# Patient Record
Sex: Female | Born: 1955 | Race: White | Hispanic: No | Marital: Married | Smoking: Current every day smoker
Health system: Southern US, Community
[De-identification: ages and names within clinical notes are randomized; demographics above are authoritative.]

## PROBLEM LIST (undated history)

## (undated) DIAGNOSIS — I10 Essential (primary) hypertension: Secondary | ICD-10-CM

## (undated) DIAGNOSIS — N390 Urinary tract infection, site not specified: Secondary | ICD-10-CM

## (undated) DIAGNOSIS — M1990 Unspecified osteoarthritis, unspecified site: Secondary | ICD-10-CM

## (undated) DIAGNOSIS — R519 Headache, unspecified: Secondary | ICD-10-CM

## (undated) DIAGNOSIS — N838 Other noninflammatory disorders of ovary, fallopian tube and broad ligament: Secondary | ICD-10-CM

## (undated) DIAGNOSIS — R51 Headache: Secondary | ICD-10-CM

## (undated) DIAGNOSIS — Z9889 Other specified postprocedural states: Secondary | ICD-10-CM

## (undated) DIAGNOSIS — R000 Tachycardia, unspecified: Secondary | ICD-10-CM

## (undated) DIAGNOSIS — Z87442 Personal history of urinary calculi: Secondary | ICD-10-CM

## (undated) DIAGNOSIS — M5020 Other cervical disc displacement, unspecified cervical region: Secondary | ICD-10-CM

## (undated) DIAGNOSIS — R112 Nausea with vomiting, unspecified: Secondary | ICD-10-CM

## (undated) DIAGNOSIS — G8929 Other chronic pain: Secondary | ICD-10-CM

## (undated) HISTORY — PX: OTHER SURGICAL HISTORY: SHX169

## (undated) HISTORY — PX: NASAL SINUS SURGERY: SHX719

## (undated) HISTORY — PX: ABDOMINAL HYSTERECTOMY: SHX81

## (undated) HISTORY — PX: BREAST BIOPSY: SHX20

## (undated) HISTORY — PX: CHOLECYSTECTOMY: SHX55

---

## 1986-03-30 HISTORY — PX: MICROTUBOPLASTY: SHX5401

## 2015-01-29 ENCOUNTER — Other Ambulatory Visit: Payer: Self-pay | Admitting: Neurosurgery

## 2015-01-29 DIAGNOSIS — M5412 Radiculopathy, cervical region: Secondary | ICD-10-CM

## 2015-02-04 ENCOUNTER — Other Ambulatory Visit: Payer: Self-pay

## 2015-02-07 ENCOUNTER — Ambulatory Visit
Admission: RE | Admit: 2015-02-07 | Discharge: 2015-02-07 | Disposition: A | Discharge status: Home / Self Care | Payer: BLUE CROSS/BLUE SHIELD | Source: Ambulatory Visit | Attending: Neurosurgery | Admitting: Neurosurgery

## 2015-02-07 DIAGNOSIS — M5412 Radiculopathy, cervical region: Secondary | ICD-10-CM

## 2015-02-07 MED ORDER — DIAZEPAM 5 MG PO TABS
10.0000 mg | ORAL_TABLET | Freq: Once | ORAL | Status: AC
  Administered 2015-02-07: 10 mg via ORAL

## 2015-02-07 MED ORDER — IOHEXOL 300 MG/ML  SOLN
10.0000 mL | Freq: Once | INTRAMUSCULAR | Status: DC | PRN
  Administered 2015-02-07: 10 mL via INTRATHECAL

## 2015-02-07 MED ORDER — ONDANSETRON HCL 4 MG/2ML IJ SOLN
4.0000 mg | Freq: Once | INTRAMUSCULAR | Status: AC
  Administered 2015-02-07: 4 mg via INTRAMUSCULAR

## 2015-02-07 MED ORDER — MEPERIDINE HCL 100 MG/ML IJ SOLN
100.0000 mg | Freq: Once | INTRAMUSCULAR | Status: AC
  Administered 2015-02-07: 100 mg via INTRAMUSCULAR

## 2015-02-07 NOTE — Discharge Instructions (Signed)
Myelogram Discharge Instructions  1. Go home and rest quietly for the next 24 hours.  It is important to lie flat for the next 24 hours.  Get up only to go to the restroom.  You may lie in the bed or on a couch on your back, your stomach, your left side or your right side.  You may have one pillow under your head.  You may have pillows between your knees while you are on your side or under your knees while you are on your back.  2. DO NOT drive today.  Recline the seat as far back as it will go, while still wearing your seat belt, on the way home.  3. You may get up to go to the bathroom as needed.  You may sit up for 10 minutes to eat.  You may resume your normal diet and medications unless otherwise indicated.  Drink lots of extra fluids today and tomorrow.  4. The incidence of headache, nausea, or vomiting is about 5% (one in 20 patients).  If you develop a headache, lie flat and drink plenty of fluids until the headache goes away.  Caffeinated beverages may be helpful.  If you develop severe nausea and vomiting or a headache that does not go away with flat bed rest, call [phone].  5. You may resume normal activities after your 24 hours of bed rest is over; however, do not exert yourself strongly or do any heavy lifting tomorrow. If when you get up you have a headache when standing, go back to bed and force fluids for another 24 hours.  6. Call your physician for a follow-up appointment.  The results of your myelogram will be sent directly to your physician by the following day.  7. If you have any questions or if complications develop after you arrive home, please call [phone].  Discharge instructions have been explained to the patient.  The patient, or the person responsible for the patient, fully understands these instructions.       May resume Ritalin and Tramadol on Nov. 11, 2016, after 1:00 pm.

## 2015-02-07 NOTE — Progress Notes (Signed)
Patient states she has been off Ritalin and Tramadol for at least the past two days.

## 2015-03-07 ENCOUNTER — Other Ambulatory Visit: Payer: Self-pay | Admitting: Neurosurgery

## 2015-03-20 ENCOUNTER — Encounter (HOSPITAL_COMMUNITY)
Admission: RE | Admit: 2015-03-20 | Discharge: 2015-03-20 | Disposition: A | Discharge status: Home / Self Care | Payer: BLUE CROSS/BLUE SHIELD | Source: Ambulatory Visit | Attending: Neurosurgery | Admitting: Neurosurgery

## 2015-03-20 ENCOUNTER — Encounter (HOSPITAL_COMMUNITY): Payer: Self-pay

## 2015-03-20 ENCOUNTER — Inpatient Hospital Stay (HOSPITAL_COMMUNITY): Payer: BLUE CROSS/BLUE SHIELD | Admitting: Vascular Surgery

## 2015-03-20 ENCOUNTER — Inpatient Hospital Stay (HOSPITAL_COMMUNITY): Payer: BLUE CROSS/BLUE SHIELD | Admitting: Anesthesiology

## 2015-03-20 DIAGNOSIS — Z01818 Encounter for other preprocedural examination: Secondary | ICD-10-CM | POA: Insufficient documentation

## 2015-03-20 DIAGNOSIS — M4722 Other spondylosis with radiculopathy, cervical region: Secondary | ICD-10-CM | POA: Insufficient documentation

## 2015-03-20 DIAGNOSIS — Z01812 Encounter for preprocedural laboratory examination: Secondary | ICD-10-CM | POA: Insufficient documentation

## 2015-03-20 DIAGNOSIS — I10 Essential (primary) hypertension: Secondary | ICD-10-CM | POA: Insufficient documentation

## 2015-03-20 HISTORY — DX: Tachycardia, unspecified: R00.0

## 2015-03-20 HISTORY — DX: Other cervical disc displacement, unspecified cervical region: M50.20

## 2015-03-20 HISTORY — DX: Essential (primary) hypertension: I10

## 2015-03-20 HISTORY — DX: Headache, unspecified: R51.9

## 2015-03-20 HISTORY — DX: Headache: R51

## 2015-03-20 HISTORY — DX: Unspecified osteoarthritis, unspecified site: M19.90

## 2015-03-20 HISTORY — DX: Other specified postprocedural states: Z98.890

## 2015-03-20 HISTORY — DX: Urinary tract infection, site not specified: N39.0

## 2015-03-20 HISTORY — DX: Other noninflammatory disorders of ovary, fallopian tube and broad ligament: N83.8

## 2015-03-20 HISTORY — DX: Other chronic pain: G89.29

## 2015-03-20 HISTORY — DX: Personal history of urinary calculi: Z87.442

## 2015-03-20 HISTORY — DX: Nausea with vomiting, unspecified: R11.2

## 2015-03-20 LAB — CBC
HEMATOCRIT: 40.8 % (ref 36.0–46.0)
Hemoglobin: 13.7 g/dL (ref 12.0–15.0)
MCH: 29.5 pg (ref 26.0–34.0)
MCHC: 33.6 g/dL (ref 30.0–36.0)
MCV: 87.7 fL (ref 78.0–100.0)
PLATELETS: 256 10*3/uL (ref 150–400)
RBC: 4.65 MIL/uL (ref 3.87–5.11)
RDW: 13.7 % (ref 11.5–15.5)
WBC: 5.9 10*3/uL (ref 4.0–10.5)

## 2015-03-20 LAB — SURGICAL PCR SCREEN
MRSA, PCR: NEGATIVE
Staphylococcus aureus: NEGATIVE

## 2015-03-20 LAB — BASIC METABOLIC PANEL
ANION GAP: 8 (ref 5–15)
BUN: 13 mg/dL (ref 6–20)
CO2: 23 mmol/L (ref 22–32)
Calcium: 9.3 mg/dL (ref 8.9–10.3)
Chloride: 110 mmol/L (ref 101–111)
Creatinine, Ser: 0.79 mg/dL (ref 0.44–1.00)
GLUCOSE: 97 mg/dL (ref 65–99)
POTASSIUM: 4.2 mmol/L (ref 3.5–5.1)
Sodium: 141 mmol/L (ref 135–145)

## 2015-03-20 NOTE — Progress Notes (Signed)
PCP - [facility] - denies  EKG - 03/20/2015 CXR - denies  Echo/Stress test/cardiac cath - denies  Patient denies chest pain and shortness of breath at PAT appointment.  Patient educated to stop taking Soma, Celebrex, and all anti inflammatories 5-7 days prior to surgery.  Patient states that she will do her best but she may not be able to stop those medications because they are the only ones that help with pain.  Patient educated on the risks of continuing those medications.

## 2015-03-20 NOTE — Pre-Procedure Instructions (Signed)
    Darl PikesSusan Bauernfeind  03/20/2015      [id number] - [place], [place] - [address] [place] [id number] Phone: [phone] Fax: [phone]    Your procedure is scheduled on Wednesday, December 28th, 2016.  [facility]:15 [facility].M.  Call this number if you have problems the morning of surgery:  [phone]   Remember:  Do not eat food or drink liquids after midnight.   Take these medicines the morning of surgery with A SIP OF WATER: Cyclobenzaprine (Flexeril), Diltiazem (Tiazac), Hydrocodone-acetaminophen (Norco/Vicodin) if needed, Ondansetron (Zofran) if needed, Proair inhaler if needed (please bring with you), Tramadol (Ultram) if needed.  Stop taking: Carisoprodol (Soma), Celecoxib (Celebrex), Aspirin, NSAIDs, Aleve, Naproxen, Ibuprofen, Advil, Motrin, BC's, Goody's, Fish oil, all herbal medications, and all vitamins.    Do not wear jewelry, make-up or nail polish.  Do not wear lotions, powders, or perfumes.  You may wear deodorant.  Do not shave 48 hours prior to surgery.    Do not bring valuables to the hospital.  [facility] is not responsible for any belongings or valuables.  Contacts, dentures or bridgework may not be worn into surgery.  Leave your suitcase in the car.  After surgery it may be brought to your room.  For patients admitted to the hospital, discharge time will be determined by your treatment team.  Patients discharged the day of surgery will not be allowed to drive home.   Special instructions:  See attached.   Please read over the following fact sheets that you were given. Pain Booklet, Coughing and Deep Breathing, MRSA Information and Surgical Site Infection Prevention

## 2015-03-26 MED ORDER — CEFAZOLIN SODIUM-DEXTROSE 2-3 GM-% IV SOLR
2.0000 g | INTRAVENOUS | Status: AC
  Administered 2015-03-27: 2 g via INTRAVENOUS
  Filled 2015-03-26: qty 50

## 2015-03-27 ENCOUNTER — Encounter (HOSPITAL_COMMUNITY)
Admission: RE | Disposition: A | Discharge status: Home / Self Care | Payer: Self-pay | Source: Ambulatory Visit | Attending: Neurosurgery

## 2015-03-27 ENCOUNTER — Inpatient Hospital Stay (HOSPITAL_COMMUNITY): Payer: BLUE CROSS/BLUE SHIELD | Admitting: Anesthesiology

## 2015-03-27 ENCOUNTER — Encounter (HOSPITAL_COMMUNITY): Payer: Self-pay

## 2015-03-27 ENCOUNTER — Inpatient Hospital Stay (HOSPITAL_COMMUNITY)
Admission: RE | Admit: 2015-03-27 | Discharge: 2015-03-28 | DRG: 473 | Disposition: A | Discharge status: Home / Self Care | Payer: BLUE CROSS/BLUE SHIELD | Source: Ambulatory Visit | Attending: Neurosurgery | Admitting: Neurosurgery

## 2015-03-27 ENCOUNTER — Inpatient Hospital Stay (HOSPITAL_COMMUNITY): Payer: BLUE CROSS/BLUE SHIELD

## 2015-03-27 DIAGNOSIS — Z79899 Other long term (current) drug therapy: Secondary | ICD-10-CM | POA: Diagnosis not present

## 2015-03-27 DIAGNOSIS — F17210 Nicotine dependence, cigarettes, uncomplicated: Secondary | ICD-10-CM | POA: Diagnosis present

## 2015-03-27 DIAGNOSIS — Z419 Encounter for procedure for purposes other than remedying health state, unspecified: Secondary | ICD-10-CM

## 2015-03-27 DIAGNOSIS — M5010 Cervical disc disorder with radiculopathy, unspecified cervical region: Principal | ICD-10-CM | POA: Diagnosis present

## 2015-03-27 DIAGNOSIS — I10 Essential (primary) hypertension: Secondary | ICD-10-CM | POA: Diagnosis present

## 2015-03-27 DIAGNOSIS — M47892 Other spondylosis, cervical region: Secondary | ICD-10-CM | POA: Diagnosis present

## 2015-03-27 DIAGNOSIS — M47812 Spondylosis without myelopathy or radiculopathy, cervical region: Secondary | ICD-10-CM | POA: Diagnosis present

## 2015-03-27 DIAGNOSIS — M542 Cervicalgia: Secondary | ICD-10-CM | POA: Diagnosis present

## 2015-03-27 HISTORY — PX: ANTERIOR CERVICAL DECOMP/DISCECTOMY FUSION: SHX1161

## 2015-03-27 SURGERY — ANTERIOR CERVICAL DECOMPRESSION/DISCECTOMY FUSION 3 LEVELS
Anesthesia: General | Site: Neck

## 2015-03-27 MED ORDER — DEXAMETHASONE SODIUM PHOSPHATE 10 MG/ML IJ SOLN
INTRAMUSCULAR | Status: AC
  Filled 2015-03-27: qty 1

## 2015-03-27 MED ORDER — SUFENTANIL CITRATE 50 MCG/ML IV SOLN
INTRAVENOUS | Status: DC | PRN
  Administered 2015-03-27 (×3): 10 ug via INTRAVENOUS
  Administered 2015-03-27: 20 ug via INTRAVENOUS

## 2015-03-27 MED ORDER — MORPHINE SULFATE (PF) 2 MG/ML IV SOLN: 1.0000 mg | INTRAVENOUS | Status: DC | PRN

## 2015-03-27 MED ORDER — FENTANYL CITRATE (PF) 100 MCG/2ML IJ SOLN: INTRAMUSCULAR | Status: DC | PRN

## 2015-03-27 MED ORDER — CYCLOBENZAPRINE HCL 10 MG PO TABS: 10.0000 mg | ORAL_TABLET | Freq: Three times a day (TID) | ORAL | Status: DC | PRN

## 2015-03-27 MED ORDER — METHYLPHENIDATE HCL 5 MG PO TABS
20.0000 mg | ORAL_TABLET | ORAL | Status: DC
  Administered 2015-03-28: 20 mg via ORAL
  Filled 2015-03-27: qty 4

## 2015-03-27 MED ORDER — ONDANSETRON HCL 4 MG/2ML IJ SOLN
4.0000 mg | INTRAMUSCULAR | Status: DC | PRN
Start: 2015-03-27 — End: 2015-03-28
  Administered 2015-03-27: 4 mg via INTRAVENOUS
  Filled 2015-03-27: qty 2

## 2015-03-27 MED ORDER — LABETALOL HCL 5 MG/ML IV SOLN
INTRAVENOUS | Status: DC | PRN
  Administered 2015-03-27: 5 mg via INTRAVENOUS

## 2015-03-27 MED ORDER — SUCCINYLCHOLINE CHLORIDE 20 MG/ML IJ SOLN: INTRAMUSCULAR | Status: DC | PRN

## 2015-03-27 MED ORDER — LACTATED RINGERS IV SOLN: INTRAVENOUS | Status: DC

## 2015-03-27 MED ORDER — FENTANYL CITRATE (PF) 100 MCG/2ML IJ SOLN
INTRAMUSCULAR | Status: AC
  Filled 2015-03-27: qty 2

## 2015-03-27 MED ORDER — BISACODYL 10 MG RE SUPP: 10.0000 mg | Freq: Every day | RECTAL | Status: DC | PRN

## 2015-03-27 MED ORDER — METHYLPHENIDATE HCL 5 MG PO TABS: 10.0000 mg | ORAL_TABLET | Freq: Every day | ORAL | Status: DC

## 2015-03-27 MED ORDER — 0.9 % SODIUM CHLORIDE (POUR BTL) OPTIME
TOPICAL | Status: DC | PRN
  Administered 2015-03-27: 1000 mL

## 2015-03-27 MED ORDER — LIDOCAINE HCL (CARDIAC) 20 MG/ML IV SOLN: INTRAVENOUS | Status: DC | PRN

## 2015-03-27 MED ORDER — ALBUTEROL SULFATE HFA 108 (90 BASE) MCG/ACT IN AERS: 2.0000 | INHALATION_SPRAY | RESPIRATORY_TRACT | Status: DC | PRN

## 2015-03-27 MED ORDER — SODIUM CHLORIDE 0.9 % IR SOLN
Status: DC | PRN
  Administered 2015-03-27: 13:00:00

## 2015-03-27 MED ORDER — HYDROCODONE-ACETAMINOPHEN 5-325 MG PO TABS: 1.0000 | ORAL_TABLET | ORAL | Status: DC | PRN

## 2015-03-27 MED ORDER — PHENOL 1.4 % MT LIQD: 1.0000 | OROMUCOSAL | Status: DC | PRN

## 2015-03-27 MED ORDER — TRAMADOL HCL 50 MG PO TABS: 100.0000 mg | ORAL_TABLET | Freq: Three times a day (TID) | ORAL | Status: DC | PRN

## 2015-03-27 MED ORDER — HYDROCODONE-ACETAMINOPHEN 5-325 MG PO TABS
ORAL_TABLET | ORAL | Status: AC
  Filled 2015-03-27: qty 2

## 2015-03-27 MED ORDER — BACITRACIN ZINC 500 UNIT/GM EX OINT
TOPICAL_OINTMENT | CUTANEOUS | Status: DC | PRN
  Administered 2015-03-27: 1 via TOPICAL

## 2015-03-27 MED ORDER — SUFENTANIL CITRATE 50 MCG/ML IV SOLN
INTRAVENOUS | Status: AC
  Filled 2015-03-27: qty 1

## 2015-03-27 MED ORDER — ONDANSETRON HCL 4 MG PO TABS: 4.0000 mg | ORAL_TABLET | Freq: Three times a day (TID) | ORAL | Status: DC | PRN

## 2015-03-27 MED ORDER — DILTIAZEM HCL ER BEADS 240 MG PO CP24
240.0000 mg | ORAL_CAPSULE | Freq: Every day | ORAL | Status: DC
  Administered 2015-03-28: 240 mg via ORAL
  Filled 2015-03-27: qty 1
  Filled 2015-03-27: qty 2
  Filled 2015-03-27: qty 1

## 2015-03-27 MED ORDER — ALUM & MAG HYDROXIDE-SIMETH 200-200-20 MG/5ML PO SUSP: 30.0000 mL | Freq: Four times a day (QID) | ORAL | Status: DC | PRN

## 2015-03-27 MED ORDER — OXYCODONE-ACETAMINOPHEN 5-325 MG PO TABS
1.0000 | ORAL_TABLET | ORAL | Status: DC | PRN
Start: 2015-03-27 — End: 2015-03-28
  Administered 2015-03-27 – 2015-03-28 (×5): 2 via ORAL
  Filled 2015-03-27 (×4): qty 2

## 2015-03-27 MED ORDER — MENTHOL 3 MG MT LOZG: 1.0000 | LOZENGE | OROMUCOSAL | Status: DC | PRN

## 2015-03-27 MED ORDER — PHENYLEPHRINE HCL 10 MG/ML IJ SOLN
INTRAMUSCULAR | Status: DC | PRN
  Administered 2015-03-27 (×2): 40 ug via INTRAVENOUS
  Administered 2015-03-27: 80 ug via INTRAVENOUS

## 2015-03-27 MED ORDER — SCOPOLAMINE 1 MG/3DAYS TD PT72
1.0000 | MEDICATED_PATCH | TRANSDERMAL | Status: DC
  Administered 2015-03-27: 1.5 mg via TRANSDERMAL
  Filled 2015-03-27: qty 1

## 2015-03-27 MED ORDER — PHENYLEPHRINE 40 MCG/ML (10ML) SYRINGE FOR IV PUSH (FOR BLOOD PRESSURE SUPPORT)
PREFILLED_SYRINGE | INTRAVENOUS | Status: AC
  Filled 2015-03-27: qty 10

## 2015-03-27 MED ORDER — PROMETHAZINE HCL 25 MG/ML IJ SOLN: 6.2500 mg | INTRAMUSCULAR | Status: DC | PRN

## 2015-03-27 MED ORDER — DOCUSATE SODIUM 100 MG PO CAPS
100.0000 mg | ORAL_CAPSULE | Freq: Two times a day (BID) | ORAL | Status: DC
  Administered 2015-03-27 – 2015-03-28 (×2): 100 mg via ORAL
  Filled 2015-03-27 (×2): qty 1

## 2015-03-27 MED ORDER — PROPOFOL 10 MG/ML IV BOLUS: INTRAVENOUS | Status: DC | PRN

## 2015-03-27 MED ORDER — PROPOFOL 10 MG/ML IV BOLUS
INTRAVENOUS | Status: AC
  Filled 2015-03-27: qty 20

## 2015-03-27 MED ORDER — DEXAMETHASONE SODIUM PHOSPHATE 4 MG/ML IJ SOLN: 4.0000 mg | Freq: Four times a day (QID) | INTRAMUSCULAR | Status: AC

## 2015-03-27 MED ORDER — ALBUTEROL SULFATE (2.5 MG/3ML) 0.083% IN NEBU: 2.5000 mg | INHALATION_SOLUTION | RESPIRATORY_TRACT | Status: DC | PRN

## 2015-03-27 MED ORDER — LIDOCAINE HCL (CARDIAC) 20 MG/ML IV SOLN
INTRAVENOUS | Status: AC
  Filled 2015-03-27: qty 5

## 2015-03-27 MED ORDER — FENTANYL CITRATE (PF) 100 MCG/2ML IJ SOLN
25.0000 ug | INTRAMUSCULAR | Status: DC | PRN
  Administered 2015-03-27 (×3): 50 ug via INTRAVENOUS

## 2015-03-27 MED ORDER — ONDANSETRON HCL 4 MG/2ML IJ SOLN
INTRAMUSCULAR | Status: DC | PRN
  Administered 2015-03-27: 4 mg via INTRAVENOUS

## 2015-03-27 MED ORDER — SCOPOLAMINE 1 MG/3DAYS TD PT72
MEDICATED_PATCH | TRANSDERMAL | Status: AC
  Filled 2015-03-27: qty 1

## 2015-03-27 MED ORDER — OXYCODONE-ACETAMINOPHEN 5-325 MG PO TABS
ORAL_TABLET | ORAL | Status: AC
  Filled 2015-03-27: qty 2

## 2015-03-27 MED ORDER — ACETAMINOPHEN 325 MG PO TABS: 650.0000 mg | ORAL_TABLET | ORAL | Status: DC | PRN

## 2015-03-27 MED ORDER — ROCURONIUM BROMIDE 100 MG/10ML IV SOLN
INTRAVENOUS | Status: DC | PRN
  Administered 2015-03-27: 30 mg via INTRAVENOUS

## 2015-03-27 MED ORDER — PROPOFOL 10 MG/ML IV BOLUS
INTRAVENOUS | Status: DC | PRN
  Administered 2015-03-27: 150 mg via INTRAVENOUS

## 2015-03-27 MED ORDER — DEXAMETHASONE 4 MG PO TABS
4.0000 mg | ORAL_TABLET | Freq: Four times a day (QID) | ORAL | Status: AC
  Administered 2015-03-27 – 2015-03-28 (×3): 4 mg via ORAL
  Filled 2015-03-27 (×3): qty 1

## 2015-03-27 MED ORDER — SUCCINYLCHOLINE CHLORIDE 20 MG/ML IJ SOLN
INTRAMUSCULAR | Status: DC | PRN
  Administered 2015-03-27: 100 mg via INTRAVENOUS

## 2015-03-27 MED ORDER — ACETAMINOPHEN 500 MG PO TABS
ORAL_TABLET | ORAL | Status: AC
  Administered 2015-03-27: 1000 mg
  Filled 2015-03-27: qty 2

## 2015-03-27 MED ORDER — BUPIVACAINE-EPINEPHRINE (PF) 0.5% -1:200000 IJ SOLN
INTRAMUSCULAR | Status: DC | PRN
  Administered 2015-03-27: 10 mL

## 2015-03-27 MED ORDER — ONDANSETRON HCL 4 MG/2ML IJ SOLN
INTRAMUSCULAR | Status: AC
  Filled 2015-03-27: qty 2

## 2015-03-27 MED ORDER — SURGIFOAM 100 EX MISC
CUTANEOUS | Status: DC | PRN
  Administered 2015-03-27: 13:00:00 via TOPICAL

## 2015-03-27 MED ORDER — ACETAMINOPHEN 650 MG RE SUPP: 650.0000 mg | RECTAL | Status: DC | PRN

## 2015-03-27 MED ORDER — LACTATED RINGERS IV SOLN
INTRAVENOUS | Status: DC
  Administered 2015-03-27 (×2): via INTRAVENOUS

## 2015-03-27 MED ORDER — LABETALOL HCL 5 MG/ML IV SOLN
INTRAVENOUS | Status: AC
  Filled 2015-03-27: qty 4

## 2015-03-27 MED ORDER — CEFAZOLIN SODIUM-DEXTROSE 2-3 GM-% IV SOLR
2.0000 g | Freq: Three times a day (TID) | INTRAVENOUS | Status: AC
  Administered 2015-03-27 – 2015-03-28 (×2): 2 g via INTRAVENOUS
  Filled 2015-03-27 (×2): qty 50

## 2015-03-27 MED ORDER — DEXAMETHASONE SODIUM PHOSPHATE 10 MG/ML IJ SOLN
INTRAMUSCULAR | Status: DC | PRN
  Administered 2015-03-27: 10 mg via INTRAVENOUS

## 2015-03-27 MED ORDER — DIAZEPAM 5 MG PO TABS
5.0000 mg | ORAL_TABLET | Freq: Four times a day (QID) | ORAL | Status: DC | PRN
  Administered 2015-03-27 – 2015-03-28 (×3): 5 mg via ORAL
  Filled 2015-03-27 (×3): qty 1

## 2015-03-27 MED ORDER — LIDOCAINE HCL (CARDIAC) 20 MG/ML IV SOLN
INTRAVENOUS | Status: DC | PRN
  Administered 2015-03-27: 100 mg via INTRAVENOUS

## 2015-03-27 MED ORDER — MIDAZOLAM HCL 2 MG/2ML IJ SOLN
INTRAMUSCULAR | Status: AC
  Filled 2015-03-27: qty 2

## 2015-03-27 MED ORDER — ROCURONIUM BROMIDE 50 MG/5ML IV SOLN
INTRAVENOUS | Status: AC
  Filled 2015-03-27: qty 1

## 2015-03-27 MED ORDER — MIDAZOLAM HCL 5 MG/5ML IJ SOLN
INTRAMUSCULAR | Status: DC | PRN
  Administered 2015-03-27: 2 mg via INTRAVENOUS

## 2015-03-27 SURGICAL SUPPLY — 62 items
BAG DECANTER FOR FLEXI CONT (MISCELLANEOUS) ×3 IMPLANT
BENZOIN TINCTURE PRP APPL 2/3 (GAUZE/BANDAGES/DRESSINGS) ×3 IMPLANT
BIT DRILL NEURO 2X3.1 SFT TUCH (MISCELLANEOUS) ×1 IMPLANT
BLADE SURG 15 STRL LF DISP TIS (BLADE) ×1 IMPLANT
BLADE SURG 15 STRL SS (BLADE) ×2
BLADE ULTRA TIP 2M (BLADE) ×3 IMPLANT
BRUSH SCRUB EZ PLAIN DRY (MISCELLANEOUS) ×3 IMPLANT
BUR BARREL STRAIGHT FLUTE 4.0 (BURR) ×3 IMPLANT
BUR MATCHSTICK NEURO 3.0 LAGG (BURR) ×3 IMPLANT
CAGE PEEK VISTAS 11X14X6 (Cage) ×3 IMPLANT
CANISTER SUCT 3000ML PPV (MISCELLANEOUS) ×3 IMPLANT
CLOSURE WOUND 1/2 X4 (GAUZE/BANDAGES/DRESSINGS) ×1
COVER MAYO STAND STRL (DRAPES) ×3 IMPLANT
DRAPE LAPAROTOMY 100X72 PEDS (DRAPES) ×3 IMPLANT
DRAPE MICROSCOPE LEICA (MISCELLANEOUS) IMPLANT
DRAPE POUCH INSTRU U-SHP 10X18 (DRAPES) ×3 IMPLANT
DRAPE SURG 17X23 STRL (DRAPES) ×6 IMPLANT
DRILL NEURO 2X3.1 SOFT TOUCH (MISCELLANEOUS) ×3
ELECT REM PT RETURN 9FT ADLT (ELECTROSURGICAL) ×3
ELECTRODE REM PT RTRN 9FT ADLT (ELECTROSURGICAL) ×1 IMPLANT
GAUZE SPONGE 4X4 12PLY STRL (GAUZE/BANDAGES/DRESSINGS) ×3 IMPLANT
GAUZE SPONGE 4X4 16PLY XRAY LF (GAUZE/BANDAGES/DRESSINGS) IMPLANT
GLOVE BIO SURGEON STRL SZ8 (GLOVE) ×3 IMPLANT
GLOVE BIO SURGEON STRL SZ8.5 (GLOVE) ×3 IMPLANT
GLOVE BIOGEL M 8.0 STRL (GLOVE) ×3 IMPLANT
GLOVE BIOGEL PI IND STRL 7.0 (GLOVE) ×3 IMPLANT
GLOVE BIOGEL PI IND STRL 8 (GLOVE) ×2 IMPLANT
GLOVE BIOGEL PI INDICATOR 7.0 (GLOVE) ×6
GLOVE BIOGEL PI INDICATOR 8 (GLOVE) ×4
GLOVE EXAM NITRILE LRG STRL (GLOVE) IMPLANT
GLOVE EXAM NITRILE MD LF STRL (GLOVE) IMPLANT
GLOVE EXAM NITRILE XL STR (GLOVE) IMPLANT
GLOVE EXAM NITRILE XS STR PU (GLOVE) IMPLANT
GOWN STRL REUS W/ TWL LRG LVL3 (GOWN DISPOSABLE) ×3 IMPLANT
GOWN STRL REUS W/ TWL XL LVL3 (GOWN DISPOSABLE) ×2 IMPLANT
GOWN STRL REUS W/TWL LRG LVL3 (GOWN DISPOSABLE) ×6
GOWN STRL REUS W/TWL XL LVL3 (GOWN DISPOSABLE) ×4
KIT BASIN OR (CUSTOM PROCEDURE TRAY) ×3 IMPLANT
KIT ROOM TURNOVER OR (KITS) ×3 IMPLANT
MARKER SKIN DUAL TIP RULER LAB (MISCELLANEOUS) ×3 IMPLANT
NEEDLE HYPO 22GX1.5 SAFETY (NEEDLE) ×3 IMPLANT
NEEDLE SPNL 18GX3.5 QUINCKE PK (NEEDLE) ×3 IMPLANT
NS IRRIG 1000ML POUR BTL (IV SOLUTION) ×3 IMPLANT
PACK LAMINECTOMY NEURO (CUSTOM PROCEDURE TRAY) ×3 IMPLANT
PATTIES SURGICAL .5 X.5 (GAUZE/BANDAGES/DRESSINGS) ×3 IMPLANT
PATTIES SURGICAL 1X1 (DISPOSABLE) ×3 IMPLANT
PEEK S VISTA 7X11X14 (Peek) ×6 IMPLANT
PIN DISTRACTION 14MM (PIN) ×6 IMPLANT
PLATE ANT CERV XTEND 3 LV 45 (Plate) ×3 IMPLANT
PUTTY BIOACTIVE 5CC KINEX (Putty) ×3 IMPLANT
RUBBERBAND STERILE (MISCELLANEOUS) IMPLANT
SCREW XTD VAR 4.2 SELF TAP 12 (Screw) ×24 IMPLANT
SPONGE INTESTINAL PEANUT (DISPOSABLE) ×6 IMPLANT
SPONGE SURGIFOAM ABS GEL 100 (HEMOSTASIS) ×3 IMPLANT
STRIP CLOSURE SKIN 1/2X4 (GAUZE/BANDAGES/DRESSINGS) ×2 IMPLANT
SUT VIC AB 0 CT1 27 (SUTURE) ×4
SUT VIC AB 0 CT1 27XBRD ANTBC (SUTURE) ×2 IMPLANT
SUT VIC AB 3-0 SH 8-18 (SUTURE) ×6 IMPLANT
TAPE CLOTH SURG 4X10 WHT LF (GAUZE/BANDAGES/DRESSINGS) ×3 IMPLANT
TOWEL OR 17X24 6PK STRL BLUE (TOWEL DISPOSABLE) ×3 IMPLANT
TOWEL OR 17X26 10 PK STRL BLUE (TOWEL DISPOSABLE) ×3 IMPLANT
WATER STERILE IRR 1000ML POUR (IV SOLUTION) ×3 IMPLANT

## 2015-03-27 NOTE — Anesthesia Preprocedure Evaluation (Deleted)
Anesthesia Evaluation  Patient identified by MRN, date of birth, ID band Patient awake    Reviewed: Allergy & Precautions, NPO status , Patient's Chart, lab work & pertinent test results  History of Anesthesia Complications (+) PONV and history of anesthetic complications  Airway Mallampati: II  TM Distance: >3 FB Neck ROM: Limited    Dental  (+) Teeth Intact, Dental Advisory Given   Pulmonary Current Smoker,    Pulmonary exam normal breath sounds clear to auscultation       Cardiovascular hypertension, Pt. on medications Normal cardiovascular exam+ dysrhythmias (sinus tachycardia)  Rhythm:Regular Rate:Normal  EKG 03/20/15: NSR   Neuro/Psych  Headaches, negative psych ROS   GI/Hepatic negative GI ROS, Neg liver ROS,   Endo/Other  negative endocrine ROS  Renal/GU negative Renal ROS     Musculoskeletal  (+) Arthritis , Osteoarthritis,    Abdominal Normal abdominal exam  (+)   Peds  Hematology negative hematology ROS (+)   Anesthesia Other Findings Day of surgery medications reviewed with the patient.  Reproductive/Obstetrics negative OB ROS                          Anesthesia Physical Anesthesia Plan  ASA: II  Anesthesia Plan: General   Post-op Pain Management:    Induction: Intravenous  Airway Management Planned: Oral ETT  Additional Equipment:   Intra-op Plan:   Post-operative Plan: Extubation in OR  Informed Consent: I have reviewed the patients History and Physical, chart, labs and discussed the procedure including the risks, benefits and alternatives for the proposed anesthesia with the patient or authorized representative who has indicated his/her understanding and acceptance.   Dental advisory given  Plan Discussed with: CRNA  Anesthesia Plan Comments: (Risks/benefits of general anesthesia discussed with patient including risk of damage to teeth, lips, gum, and tongue,  nausea/vomiting, allergic reactions to medications, and the possibility of heart attack, stroke and death.  All patient questions answered.  Patient wishes to proceed.)        Anesthesia Quick Evaluation

## 2015-03-27 NOTE — Progress Notes (Signed)
Subjective:  The patient is somnolent but easily arousable. She is in no apparent distress. She looks well.  Objective: Vital signs in last 24 hours: Temp:  [97.2 F (36.2 C)-99.2 F (37.3 C)] 99.2 F (37.3 C) (12/28 1545) Pulse Rate:  [106-113] 113 (12/28 1545) Resp:  [20] 20 (12/28 1545) BP: (136-148)/(92-97) 148/97 mmHg (12/28 1545) SpO2:  [96 %-98 %] 96 % (12/28 1545) Weight:  [67.586 kg (149 lb)] 67.586 kg (149 lb) (12/28 0915)  Intake/Output from previous day:   Intake/Output this shift: Total I/O In: 1300 [I.V.:1300] Out: 100 [Blood:100]  Physical exam the patient is somewhat but arousable. She is moving all 4 extremities well. Her deltoid strength is normal bilaterally. Her dressing is clean and dry. There is no hematoma or shift.  Lab Results: No results for input(s): WBC, HGB, HCT, PLT in the last 72 hours. BMET No results for input(s): NA, K, CL, CO2, GLUCOSE, BUN, CREATININE, CALCIUM in the last 72 hours.  Studies/Results: Dg Cervical Spine 2-3 Views  03/27/2015  CLINICAL DATA:  Anterior surgical fusion. EXAM: CERVICAL SPINE - 2-3 VIEW COMPARISON:  CT scan of February 07, 2015. FINDINGS: Two intraoperative cross-table lateral projections of the cervical spine were obtained. The first image demonstrates probe overlying anterior portion of the C4-5 disc space. Second image demonstrates patient be status post surgical fusion extending from C3-C6 with good alignment of the vertebral bodies and interbody fusion. IMPRESSION: Status post surgical anterior fusion of C3 through C6. Electronically Signed   By: Lupita RaiderJames  Green Jr, M.D.   On: 03/27/2015 15:38    Assessment/Plan: The patient is doing well.  LOS: 0 days     Lanorris Kalisz D 03/27/2015, 3:57 PM

## 2015-03-27 NOTE — Anesthesia Preprocedure Evaluation (Signed)
Anesthesia Evaluation  Patient identified by MRN, date of birth, ID band Patient awake    Reviewed: Allergy & Precautions, NPO status , Patient's Chart, lab work & pertinent test results  History of Anesthesia Complications (+) PONV and history of anesthetic complications  Airway Mallampati: II  TM Distance: >3 FB Neck ROM: Full    Dental  (+) Teeth Intact, Dental Advisory Given   Pulmonary Current Smoker,    Pulmonary exam normal breath sounds clear to auscultation       Cardiovascular hypertension, Pt. on medications Normal cardiovascular exam+ dysrhythmias (Sinus tachycardia)  Rhythm:Regular Rate:Normal  EKG 03/20/15: NSR   Neuro/Psych  Headaches, BUE numbness, weakness L>R negative psych ROS   GI/Hepatic negative GI ROS, Neg liver ROS,   Endo/Other  negative endocrine ROS  Renal/GU negative Renal ROS     Musculoskeletal  (+) Arthritis , Osteoarthritis,    Abdominal   Peds  Hematology negative hematology ROS (+)   Anesthesia Other Findings Day of surgery medications reviewed with the patient.  Reproductive/Obstetrics                             Anesthesia Physical Anesthesia Plan  ASA: II  Anesthesia Plan: General   Post-op Pain Management:    Induction: Intravenous  Airway Management Planned: Oral ETT  Additional Equipment:   Intra-op Plan:   Post-operative Plan: Extubation in OR  Informed Consent: I have reviewed the patients History and Physical, chart, labs and discussed the procedure including the risks, benefits and alternatives for the proposed anesthesia with the patient or authorized representative who has indicated his/her understanding and acceptance.   Dental advisory given  Plan Discussed with: CRNA  Anesthesia Plan Comments: (Risks/benefits of general anesthesia discussed with patient including risk of damage to teeth, lips, gum, and tongue,  nausea/vomiting, allergic reactions to medications, and the possibility of heart attack, stroke and death.  All patient questions answered.  Patient wishes to proceed.)        Anesthesia Quick Evaluation

## 2015-03-27 NOTE — Anesthesia Postprocedure Evaluation (Signed)
Anesthesia Post Note  Patient: Shannon PikesSusan Seales  Procedure(s) Performed: Procedure(s) (LRB): Cervical three-four, Cervical four-five, Cervical five-six anterior cervical decompression with fusion interbody prosthesis plating and bonegraft (N/A)  Patient location during evaluation: PACU Anesthesia Type: General Level of consciousness: awake and alert Pain management: pain level controlled Vital Signs Assessment: post-procedure vital signs reviewed and stable Respiratory status: spontaneous breathing, nonlabored ventilation, respiratory function stable and patient connected to nasal cannula oxygen Cardiovascular status: blood pressure returned to baseline and stable Postop Assessment: no signs of nausea or vomiting Anesthetic complications: no    Last Vitals:  Filed Vitals:   03/27/15 0915 03/27/15 1545  BP: 136/92 148/97  Pulse: 106 113  Temp: 36.2 C 37.3 C  Resp: 20 20    Last Pain:  Filed Vitals:   03/27/15 1556  PainSc: 8                  Cecile HearingStephen Edward Turk

## 2015-03-27 NOTE — Transfer of Care (Signed)
Immediate Anesthesia Transfer of Care Note  Patient: Shannon Gilbert  Procedure(s) Performed: Procedure(s) with comments: Cervical three-four, Cervical four-five, Cervical five-six anterior cervical decompression with fusion interbody prosthesis plating and bonegraft (N/A) - Cervical three-four, Cervical four-five, Cervical five-six anterior cervical decompression with fusion interbody prosthesis plating and bonegraft  Patient Location: PACU  Anesthesia Type:General  Level of Consciousness: awake and patient cooperative  Airway & Oxygen Therapy: Patient Spontanous Breathing and Patient connected to nasal cannula oxygen  Post-op Assessment: Report given to RN  Post vital signs: Reviewed and stable  Last Vitals:  Filed Vitals:   03/27/15 0915  BP: 136/92  Pulse: 106  Temp: 36.2 C  Resp: 20    Complications: No apparent anesthesia complications

## 2015-03-27 NOTE — H&P (Signed)
Subjective: The patient is a 59 year old white female who has complained of chronic neck, shoulder, and arm pain consistent with a cervical radiculopathy. She has failed medical management and was worked up with a cervical MRI and myelogram CT. This demonstrated the patient had multilevel degenerative changes, spondylosis, facet arthropathy, etc. I discussed the various treatment options with the patient including surgery. She has weighed the risks, benefits, and alternatives surgery and decided to proceed with a C3-4, C4-5 and C5-6 anterior cervical discectomy, fusion, and plating.  Past Medical History  Diagnosis Date  . Ruptured, ovary   . Hypertension   . Sinus tachycardia (HCC)   . Ruptured disc, cervical   . UTI (urinary tract infection)     "not recurring"   . Headache     migraines  . PONV (postoperative nausea and vomiting)   . History of kidney stones   . Chronic pain   . Arthritis     Past Surgical History  Procedure Laterality Date  . Abdominal hysterectomy    . Cholecystectomy    . Nasal sinus surgery    . Surgery on fallopian tubes    . Breast biopsy Left     x2  . Microtuboplasty  1988    Allergies  Allergen Reactions  . Inapsine [Droperidol] Other (See Comments)    Panic attack; tachycardia    Social History  Substance Use Topics  . Smoking status: Current Every Day Smoker -- 1.00 packs/day    Types: Cigarettes  . Smokeless tobacco: Not on file  . Alcohol Use: Yes     Comment: rarely - once a year    History reviewed. No pertinent family history. Prior to Admission medications   Medication Sig Start Date End Date Taking? Authorizing Provider  carisoprodol (SOMA) 350 MG tablet Take 350 mg by mouth 4 (four) times daily as needed for muscle spasms.  03/11/15  Yes Historical Provider, MD  celecoxib (CELEBREX) 200 MG capsule Take 200 mg by mouth 2 (two) times daily as needed for mild pain.    Yes Historical Provider, MD  conjugated estrogens (PREMARIN)  vaginal cream Place 1 Applicatorful vaginally 2 (two) times a week. Tuesdays and Fridays   Yes Historical Provider, MD  cyclobenzaprine (FLEXERIL) 10 MG tablet Take 10 mg by mouth 3 (three) times daily as needed for muscle spasms.    Yes Historical Provider, MD  diltiazem (TIAZAC) 240 MG 24 hr capsule Take 240 mg by mouth daily.   Yes Historical Provider, MD  HYDROcodone-acetaminophen (NORCO/VICODIN) 5-325 MG tablet Take 1 tablet by mouth 3 (three) times daily as needed for moderate pain.  03/06/15  Yes Historical Provider, MD  methylphenidate (RITALIN) 10 MG tablet Take 10 mg by mouth daily with lunch.   Yes Historical Provider, MD  methylphenidate (RITALIN) 20 MG tablet Take 20 mg by mouth daily.   Yes Historical Provider, MD  ondansetron (ZOFRAN) 4 MG tablet Take 4 mg by mouth every 8 (eight) hours as needed for nausea.    Yes Historical Provider, MD  PROAIR HFA 108 (90 BASE) MCG/ACT inhaler Inhale 2 puffs into the lungs every 4 (four) hours as needed for wheezing or shortness of breath.  03/01/15  Yes Historical Provider, MD  traMADol (ULTRAM) 50 MG tablet Take 100 mg by mouth every 8 (eight) hours as needed for moderate pain.    Yes Historical Provider, MD  vitamin B-12 (CYANOCOBALAMIN) 1000 MCG tablet Take 1,000 mcg by mouth daily.   Yes Historical Provider, MD  zolmitriptan (  ZOMIG) 5 MG tablet Take 5 mg by mouth every 8 (eight) hours as needed for migraine.  03/06/15  Yes Historical Provider, MD     Review of Systems  Positive ROS: As above  All other systems have been reviewed and were otherwise negative with the exception of those mentioned in the HPI and as above.  Objective: Vital signs in last 24 hours: Temp:  [97.2 F (36.2 C)] 97.2 F (36.2 C) (12/28 0915) Pulse Rate:  [106] 106 (12/28 0915) Resp:  [20] 20 (12/28 0915) BP: (136)/(92) 136/92 mmHg (12/28 0915) SpO2:  [98 %] 98 % (12/28 0915) Weight:  [67.586 kg (149 lb)] 67.586 kg (149 lb) (12/28 0915)  General Appearance:  Alert, cooperative, no distress, Head: Normocephalic, without obvious abnormality, atraumatic Eyes: PERRL, conjunctiva/corneas clear, EOM's intact,    Ears: Normal  Throat: Normal  Neck: Supple, symmetrical, trachea midline, no adenopathy; thyroid: No enlargement/tenderness/nodules; no carotid bruit or JVD. The patient has a limited cervical range of motion. Spurling's testing is positive. Back: Symmetric, no curvature, ROM normal, no CVA tenderness Lungs: Clear to auscultation bilaterally, respirations unlabored Heart: Regular rate and rhythm, no murmur, rub or gallop Abdomen: Soft, non-tender,, no masses, no organomegaly Extremities: Extremities normal, atraumatic, no cyanosis or edema Pulses: 2+ and symmetric all extremities Skin: Skin color, texture, turgor normal, no rashes or lesions  NEUROLOGIC:   Mental status: alert and oriented, no aphasia, good attention span, Fund of knowledge/ memory ok Motor Exam - grossly normal Sensory Exam - grossly normal Reflexes:  Coordination - grossly normal Gait - grossly normal Balance - grossly normal Cranial Nerves: I: smell Not tested  II: visual acuity  OS: Normal  OD: Normal   II: visual fields Full to confrontation  II: pupils Equal, round, reactive to light  III,VII: ptosis None  III,IV,VI: extraocular muscles  Full ROM  V: mastication Normal  V: facial light touch sensation  Normal  V,VII: corneal reflex  Present  VII: facial muscle function - upper  Normal  VII: facial muscle function - lower Normal  VIII: hearing Not tested  IX: soft palate elevation  Normal  IX,X: gag reflex Present  XI: trapezius strength  5/5  XI: sternocleidomastoid strength 5/5  XI: neck flexion strength  5/5  XII: tongue strength  Normal    Data Review Lab Results  Component Value Date   WBC 5.9 03/20/2015   HGB 13.7 03/20/2015   HCT 40.8 03/20/2015   MCV 87.7 03/20/2015   PLT 256 03/20/2015   Lab Results  Component Value Date   NA 141  03/20/2015   K 4.2 03/20/2015   CL 110 03/20/2015   CO2 23 03/20/2015   BUN 13 03/20/2015   CREATININE 0.79 03/20/2015   GLUCOSE 97 03/20/2015   No results found for: INR, PROTIME  Assessment/Plan: C3-4, C4-5, and C5-6 disc degeneration, spondylosis, facet arthropathy, cervicalgia, cervical radiculopathy: I have discussed the situation with the patient and reviewed her imaging studies with her. We have discussed the various treatment options including surgery. I have described the surgical treatment option C3-4, C4-5 and C5-6 anterior cervical discectomy, fusion, and plating. I have shown her surgical models. We have discussed the risks, benefits, alternatives, and likelihood of achieving our goals with surgery. I have answered all the patient's questions. She has decided to proceed with surgery.   Maghan Jessee D 03/27/2015 11:50 AM

## 2015-03-27 NOTE — Progress Notes (Signed)
Orthopedic Tech Progress Note Patient Details:  Shannon Gilbert 30-Sep-1955 [id number] Shannon Gilbert Patient ID: Shannon SisSusan Gilbert, female   DOB: 30-Sep-1955, 59 y.o.   MRN: [id number] Pt. Already has collar  Shannon Gilbert, Shannon Gilbert 03/27/2015, 5:45 PM

## 2015-03-27 NOTE — Op Note (Signed)
Brief history: The patient is a 59 year old white female who has complained of chronic neck, shoulder, and arm pain consistent with a cervical radiculopathy. She has failed medical management and was worked up with a cervical MRI. This demonstrated multilevel degenerative changes, spondylosis, facet arthropathy, etc. most prominent at C3-4, C4-5 and C5-6. I discussed the various treatment options with the patient including surgery. She has weighed the risks, benefits, and alternatives to surgery and decided proceed with the C3-4, C4-5 and C5-6 anterior cervical discectomy, fusion, and plating.  Preoperative diagnosis: C3-4, C4-5 and C5-6 disc degeneration, spondylosis, facet arthropathy, cervicalgia, cervical radiculopathy  Postoperative diagnosis: The same  Procedure: C3-4, C4-5 and C5-6 Anterior cervical discectomy/decompression; C3-4, C4-5 and C5-6 interbody arthrodesis with local morcellized autograft bone and Kinnex bone graft extender; insertion of interbody prosthesis at C3-4, C4-5 and C5-6 (Zimmer peek interbody prosthesis); anterior cervical plating from C3-C6 with globus titanium plate  Surgeon: Dr. Delma Officer  Asst.: Dr. Hilda Lias  Anesthesia: Gen. endotracheal  Estimated blood loss: 125 mL  Drains: None  Complications: None  Description of procedure: The patient was brought to the operating room by the anesthesia team. General endotracheal anesthesia was induced. A roll was placed under the patient's shoulders to keep the neck in the neutral position. The patient's anterior cervical region was then prepared with Betadine scrub and Betadine solution. Sterile drapes were applied.  The area to be incised was then injected with Marcaine with epinephrine solution. I then used a scalpel to make a transverse incision in the patient's left anterior neck. I used the Metzenbaum scissors to divide the platysmal muscle and then to dissect medial to the sternocleidomastoid muscle, jugular  vein, and carotid artery. I carefully dissected down towards the anterior cervical spine identifying the esophagus and retracting it medially. Then using Kitner swabs to clear soft tissue from the anterior cervical spine. We then inserted a bent spinal needle into the upper exposed intervertebral disc space. We then obtained intraoperative radiographs confirm our location.  I then used electrocautery to detach the medial border of the longus colli muscle bilaterally from the C3-4, C4-5 and C5-6 intervertebral disc spaces. I then inserted the Caspar self-retaining retractor underneath the longus colli muscle bilaterally to provide exposure.  We then incised the intervertebral disc at C3-4. We then performed a partial intervertebral discectomy with a pituitary forceps and the Karlin curettes. I then inserted distraction screws into the vertebral bodies at C3-4. We then distracted the interspace. We then used the high-speed drill to decorticate the vertebral endplates at C3-4, to drill away the remainder of the intervertebral disc, to drill away some posterior spondylosis, and to thin out the posterior longitudinal ligament. I then incised ligament with the arachnoid knife. We then removed the ligament with a Kerrison punches undercutting the vertebral endplates and decompressing the thecal sac. We then performed foraminotomies about the bilateral C4 nerve roots. This completed the decompression at this level.  We then repeated this procedure and analogous fashion at C4-5 and C5-6 decompressing the thecal sac at these levels as well as the bilateral C5 and C6 nerve roots.  We now turned our to attention to the interbody fusion. We used the trial spacers to determine the appropriate size for the interbody prosthesis. We then pre-filled prosthesis with a combination of local morcellized autograft bone that we obtained during decompression as well as Kinnex bone graft extender. We then inserted the prosthesis into  the distracted interspace at C3-4, C4-5 and C5-6. We then removed  the distraction screws. There was a good snug fit of the prosthesis in the interspace.  Having completed the fusion we now turned attention to the anterior spinal instrumentation. We used the high-speed drill to drill away some anterior spondylosis at the disc spaces so that the plate lay down flat. We selected the appropriate length titanium anterior cervical plate. We laid it along the anterior aspect of the vertebral bodies from C3-C6. We then drilled 12 mm holes at C3, C4, C5 and C6. We then secured the plate to the vertebral bodies by placing two 12 mm self-tapping screws at C3, C4, C5 and C6. We then obtained intraoperative radiograph. The demonstrating good position of the instrumentation. We therefore secured the screws the plate the locking each cam. This completed the instrumentation.  We then obtained hemostasis using bipolar electrocautery. We irrigated the wound out with bacitracin solution. We then removed the retractor. We inspected the esophagus for any damage. There was none apparent. We then reapproximated patient's platysmal muscle with interrupted 3-0 Vicryl suture. We then reapproximated the subcutaneous tissue with interrupted 3-0 Vicryl suture. The skin was reapproximated with Steri-Strips and benzoin. The wound was then covered with bacitracin ointment. A sterile dressing was applied. The drapes were removed. Patient was subsequently extubated by the anesthesia team and transported to the post anesthesia care unit in stable condition. All sponge instrument and needle counts were reportedly correct at the end of this case.

## 2015-03-27 NOTE — Progress Notes (Signed)
Called into pt. Room, she is stating that she has a sensation on the R side of her neck, & into her chest, spasm like.  Pt. Reports this has happened previously & its taken a swallow of a pc. of bread or cracker to resolve.    Placed on O2 sat & pulse monitor.  Sat. 100%, pulse 88-90bpm. Call to Dr. Desmond Lopeurk, reported same & he ordered for her to take 1 gm. Of Tylenol p.o. now. After swallowing, pt. Reports the sensation resolved. Pt. Transported to Holding area in Neuro

## 2015-03-27 NOTE — Anesthesia Procedure Notes (Signed)
Procedure Name: Intubation Date/Time: 03/27/2015 12:25 PM Performed by: Charm BargesBUTLER, Juno Bozard R Pre-anesthesia Checklist: Patient identified, Emergency Drugs available, Suction available, Patient being monitored and Timeout performed Patient Re-evaluated:Patient Re-evaluated prior to inductionOxygen Delivery Method: Circle system utilized Preoxygenation: Pre-oxygenation with 100% oxygen Intubation Type: IV induction Ventilation: Mask ventilation without difficulty Laryngoscope Size: Mac and 3 Grade View: Grade I Tube type: Oral Tube size: 7.5 mm Number of attempts: 1 Airway Equipment and Method: Stylet Placement Confirmation: ETT inserted through vocal cords under direct vision,  positive ETCO2 and breath sounds checked- equal and bilateral Secured at: 21 cm Tube secured with: Tape Dental Injury: Teeth and Oropharynx as per pre-operative assessment

## 2015-03-28 ENCOUNTER — Encounter (HOSPITAL_COMMUNITY): Payer: Self-pay | Admitting: Neurosurgery

## 2015-03-28 MED ORDER — DOCUSATE SODIUM 100 MG PO CAPS: 100.0000 mg | ORAL_CAPSULE | Freq: Two times a day (BID) | ORAL | Status: AC

## 2015-03-28 MED ORDER — OXYCODONE-ACETAMINOPHEN 10-325 MG PO TABS: 1.0000 | ORAL_TABLET | ORAL | Status: AC | PRN

## 2015-03-28 NOTE — Progress Notes (Signed)
Patient alert and oriented, mae's well, voiding adequate amount of urine, swallowing without difficulty, no c/o pain. Patient discharged home with family. Script and discharged instructions given to patient. Patient and family stated understanding of d/c instructions given and has an appointment with MD. 

## 2015-03-28 NOTE — Discharge Summary (Signed)
Physician Discharge Summary  Patient ID: Shannon Gilbert MRN: [id number] DOB/AGE: 58-May-1957 58 y.o.  Admit date: 03/27/2015 Discharge date: 03/28/2015  Admission Diagnoses: C3-4, C4-5 and C5-6 facet arthropathy, cervicalgia, cervical radiculopathy  Discharge Diagnoses: The same Active Problems:   Facet arthropathy, cervical (HCC)   Discharged Condition: good  Hospital Course: I performed a C3-4, C4-5 and C5-6 anterior cervical discectomy, fusion, and plating on the patient on 03/27/2015. The surgery went well.  The patient's postoperative course was unremarkable. On postoperative day #1 she requested discharge to home. She was given written and oral discharge instructions. All her questions were answered.  Consults: None Significant Diagnostic Studies: None Treatments: C3-4, C4-5 and C5-6 anterior cervical discectomy, fusion, and plating. Discharge Exam: Blood pressure 163/92, pulse 115, temperature 98.2 F (36.8 C), temperature source Oral, resp. rate 20, height  (1.6 m), weight 67.586 kg (149 lb), SpO2 97 %. The patient is alert and pleasant. She looks well. Her strength is normal and her bilateral deltoids, handgrips, and lower extremities. Her dressing is clean and dry. There is no evidence of hematoma or shift.  Disposition: Home  Discharge Instructions    Call MD for:  difficulty breathing, headache or visual disturbances    Complete by:  As directed      Call MD for:  extreme fatigue    Complete by:  As directed      Call MD for:  hives    Complete by:  As directed      Call MD for:  persistant dizziness or light-headedness    Complete by:  As directed      Call MD for:  persistant nausea and vomiting    Complete by:  As directed      Call MD for:  redness, tenderness, or signs of infection (pain, swelling, redness, odor or green/yellow discharge around incision site)    Complete by:  As directed      Call MD for:  severe uncontrolled pain    Complete by:  As  directed      Call MD for:  temperature >100.4    Complete by:  As directed      Diet - low sodium heart healthy    Complete by:  As directed      Discharge instructions    Complete by:  As directed   Call [phone] for a followup appointment. Take a stool softener while you are using pain medications.     Driving Restrictions    Complete by:  As directed   Do not drive for 2 weeks.     Increase activity slowly    Complete by:  As directed      Lifting restrictions    Complete by:  As directed   Do not lift more than 5 pounds. No excessive bending or twisting.     May shower / Bathe    Complete by:  As directed   He may shower after the pain she is removed 3 days after surgery. Leave the incision alone.     Remove dressing in 48 hours    Complete by:  As directed   Your stitches are under the scan and will dissolve by themselves. The Steri-Strips will fall off after you take a few showers. Do not rub back or pick at the wound, Leave the wound alone.            Medication List    STOP taking these medications  celecoxib 200 MG capsule  Commonly known as:  CELEBREX     cyclobenzaprine 10 MG tablet  Commonly known as:  FLEXERIL     HYDROcodone-acetaminophen 5-325 MG tablet  Commonly known as:  NORCO/VICODIN     traMADol 50 MG tablet  Commonly known as:  ULTRAM      TAKE these medications        carisoprodol 350 MG tablet  Commonly known as:  SOMA  Take 350 mg by mouth 4 (four) times daily as needed for muscle spasms.     conjugated estrogens vaginal cream  Commonly known as:  PREMARIN  Place 1 Applicatorful vaginally 2 (two) times a week. Tuesdays and Fridays     diltiazem 240 MG 24 hr capsule  Commonly known as:  TIAZAC  Take 240 mg by mouth daily.     docusate sodium 100 MG capsule  Commonly known as:  COLACE  Take 1 capsule (100 mg total) by mouth 2 (two) times daily.     methylphenidate 20 MG tablet  Commonly known as:  RITALIN  Take 20 mg by  mouth daily.     methylphenidate 10 MG tablet  Commonly known as:  RITALIN  Take 10 mg by mouth daily with lunch.     ondansetron 4 MG tablet  Commonly known as:  ZOFRAN  Take 4 mg by mouth every 8 (eight) hours as needed for nausea.     oxyCODONE-acetaminophen 10-325 MG tablet  Commonly known as:  PERCOCET  Take 1 tablet by mouth every 4 (four) hours as needed for pain.     PROAIR HFA 108 (90 Base) MCG/ACT inhaler  Generic drug:  albuterol  Inhale 2 puffs into the lungs every 4 (four) hours as needed for wheezing or shortness of breath.     vitamin B-12 1000 MCG tablet  Commonly known as:  CYANOCOBALAMIN  Take 1,000 mcg by mouth daily.     zolmitriptan 5 MG tablet  Commonly known as:  ZOMIG  Take 5 mg by mouth every 8 (eight) hours as needed for migraine.         SignedCristi Loron: Sael Furches D 03/28/2015, 9:53 AM

## 2015-03-28 NOTE — Progress Notes (Signed)
Utilization review completed.  

## 2015-03-29 ENCOUNTER — Encounter (HOSPITAL_COMMUNITY): Payer: Self-pay | Admitting: Neurosurgery

## 2016-05-11 ENCOUNTER — Other Ambulatory Visit: Payer: Self-pay | Admitting: Physician Assistant

## 2016-05-11 DIAGNOSIS — R634 Abnormal weight loss: Secondary | ICD-10-CM

## 2016-05-11 DIAGNOSIS — R1032 Left lower quadrant pain: Secondary | ICD-10-CM

## 2016-05-11 DIAGNOSIS — R1084 Generalized abdominal pain: Secondary | ICD-10-CM

## 2016-05-13 ENCOUNTER — Other Ambulatory Visit: Payer: BLUE CROSS/BLUE SHIELD

## 2016-05-13 ENCOUNTER — Ambulatory Visit
Admission: RE | Admit: 2016-05-13 | Discharge: 2016-05-13 | Disposition: A | Discharge status: Home / Self Care | Payer: 59 | Source: Ambulatory Visit | Attending: Physician Assistant | Admitting: Physician Assistant

## 2016-05-13 DIAGNOSIS — R1032 Left lower quadrant pain: Secondary | ICD-10-CM

## 2016-05-13 DIAGNOSIS — R1084 Generalized abdominal pain: Secondary | ICD-10-CM

## 2016-05-13 DIAGNOSIS — R634 Abnormal weight loss: Secondary | ICD-10-CM

## 2016-05-13 MED ORDER — IOPAMIDOL (ISOVUE-300) INJECTION 61%
100.0000 mL | Freq: Once | INTRAVENOUS | Status: AC | PRN
  Administered 2016-05-13: 100 mL via INTRAVENOUS

## 2016-11-12 ENCOUNTER — Other Ambulatory Visit: Payer: Self-pay | Admitting: Family Medicine

## 2016-11-12 ENCOUNTER — Ambulatory Visit
Admission: RE | Admit: 2016-11-12 | Discharge: 2016-11-12 | Disposition: A | Discharge status: Home / Self Care | Payer: 59 | Source: Ambulatory Visit | Attending: Family Medicine | Admitting: Family Medicine

## 2016-11-12 DIAGNOSIS — M542 Cervicalgia: Secondary | ICD-10-CM

## 2016-11-12 DIAGNOSIS — M546 Pain in thoracic spine: Secondary | ICD-10-CM

## 2018-10-11 IMAGING — CR DG THORACIC SPINE 3V
3 series · 3 of 3 positions shown · non-contrast
Comparison: None.

CLINICAL DATA: Upper back pain following fall several months ago,
initial encounter

EXAM:
THORACIC SPINE - 3 VIEWS

[w thoracic spine ap]
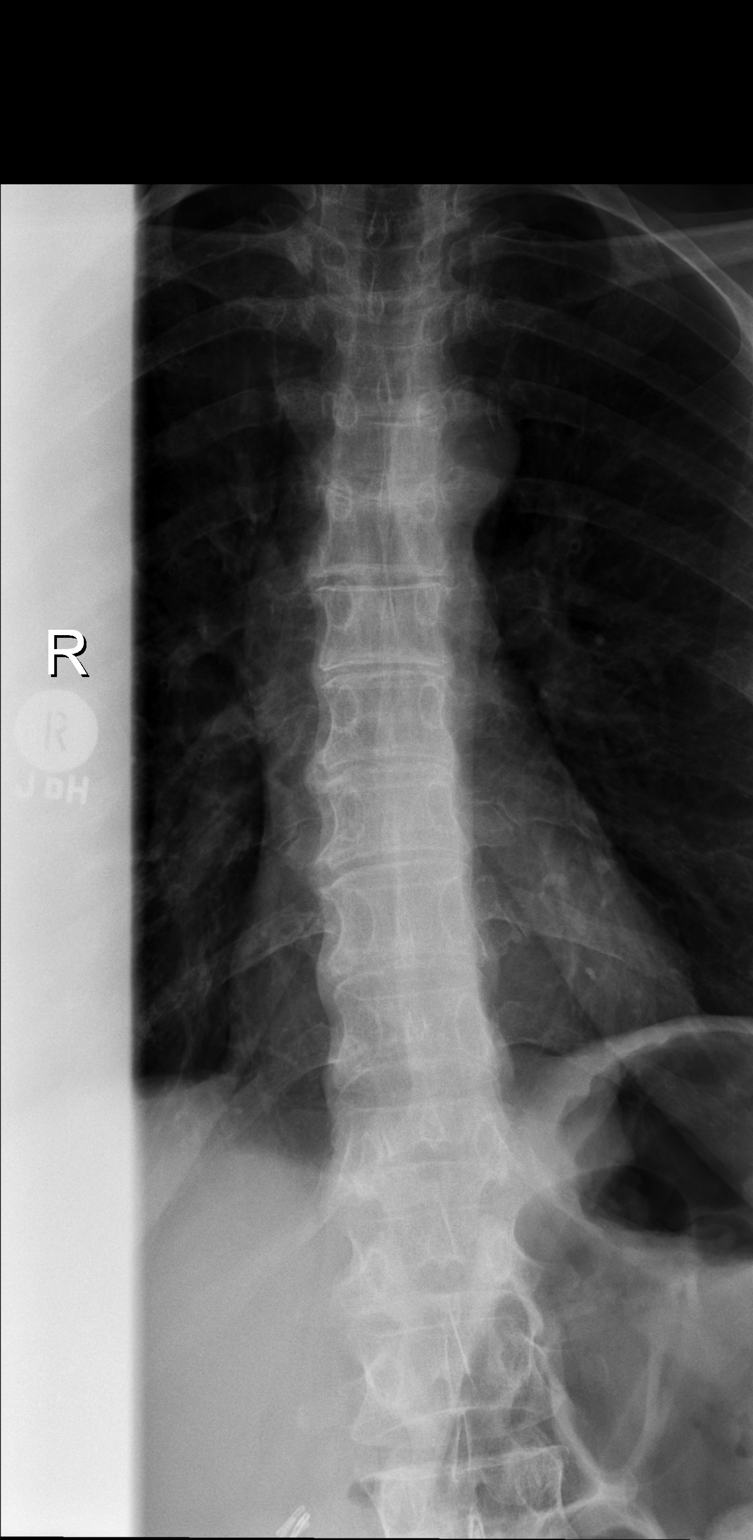

[w thoracic spine lat]
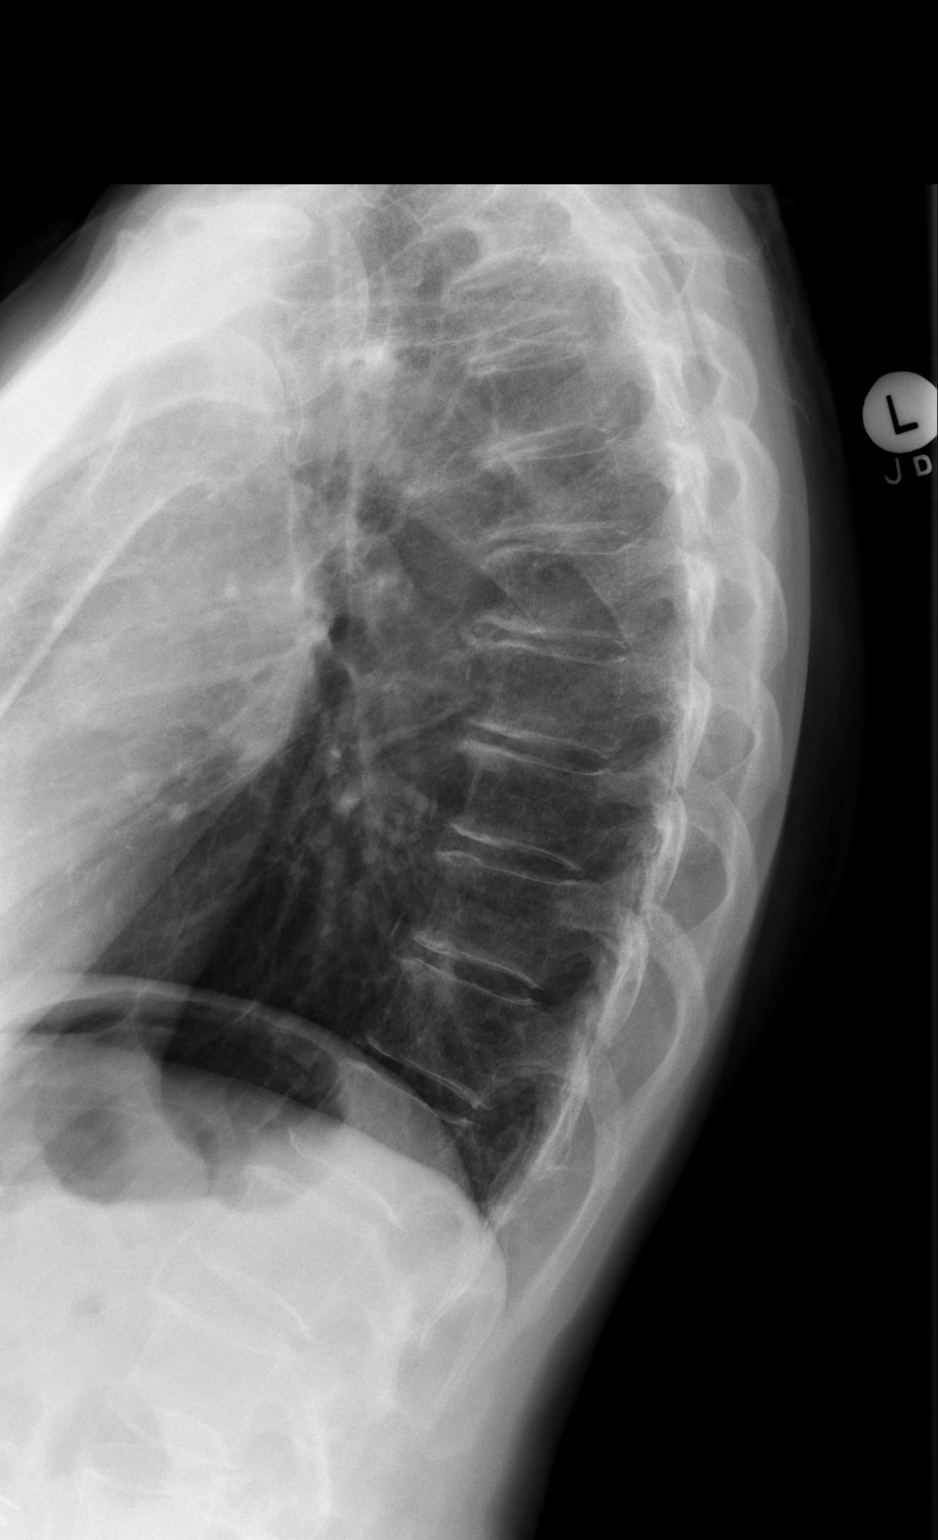

[w thoracic swimmers]
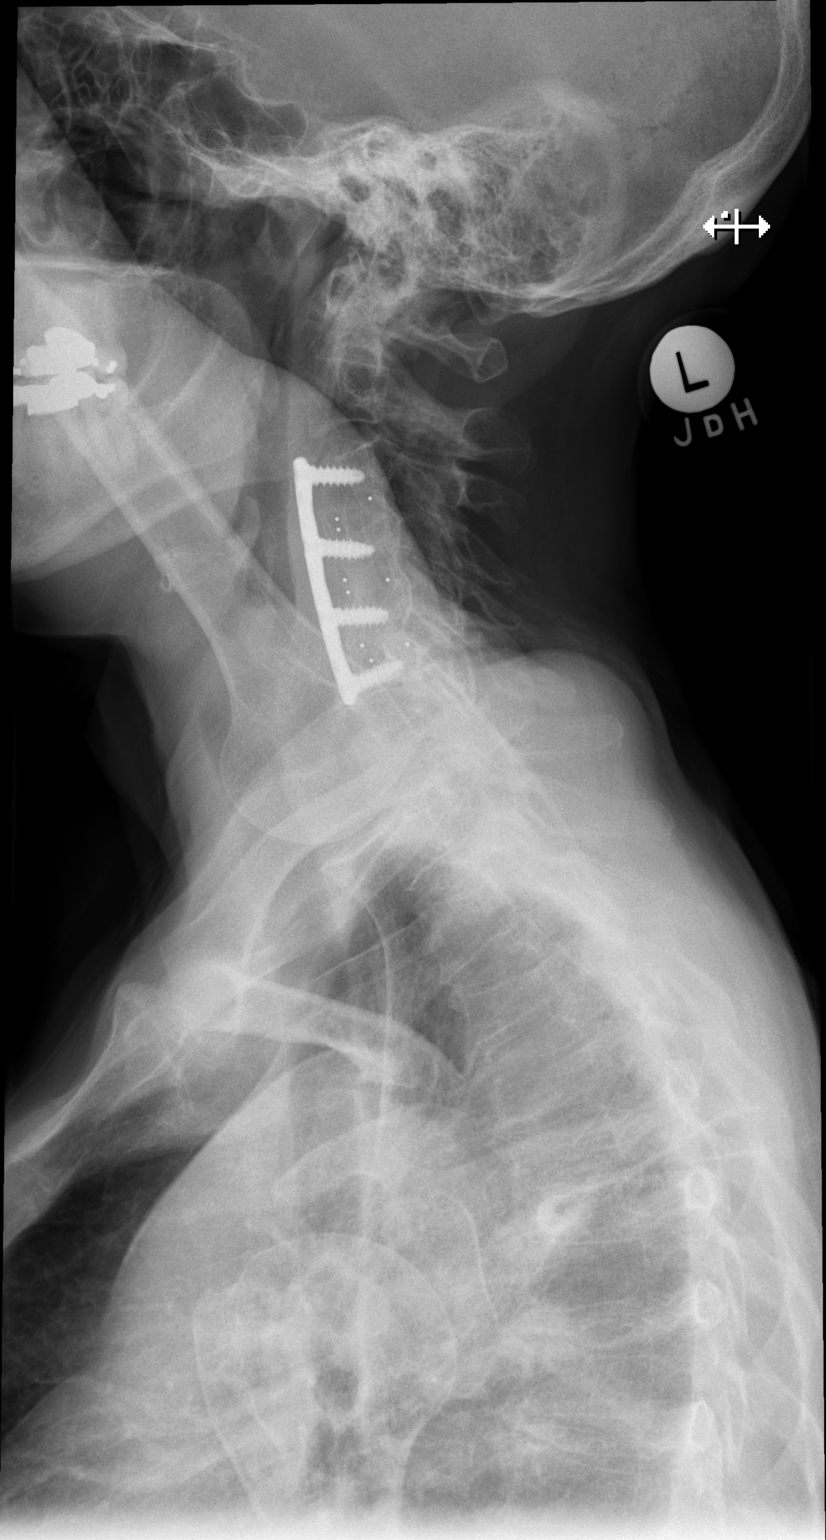

[3 of 3 positions shown; findings below may reference images not displayed]

FINDINGS: Vertebral body height is well maintained. Mild osteophytic changes
are seen. Pedicles are within normal limits. No paraspinal mass
lesion is noted.
IMPRESSION: Mild degenerative changes without acute abnormality.

## 2020-06-21 ENCOUNTER — Other Ambulatory Visit: Payer: Self-pay | Admitting: Family Medicine

## 2020-06-21 DIAGNOSIS — J42 Unspecified chronic bronchitis: Secondary | ICD-10-CM

## 2020-07-05 ENCOUNTER — Other Ambulatory Visit: Payer: 59

## 2020-07-05 ENCOUNTER — Ambulatory Visit
Admission: RE | Admit: 2020-07-05 | Discharge: 2020-07-05 | Disposition: A | Discharge status: Home / Self Care | Payer: Self-pay | Source: Ambulatory Visit | Attending: Family Medicine | Admitting: Family Medicine

## 2020-07-05 ENCOUNTER — Other Ambulatory Visit: Payer: Self-pay | Admitting: Family Medicine

## 2020-07-05 ENCOUNTER — Other Ambulatory Visit: Payer: Self-pay

## 2020-07-05 DIAGNOSIS — R059 Cough, unspecified: Secondary | ICD-10-CM

## 2021-05-13 DIAGNOSIS — L814 Other melanin hyperpigmentation: Secondary | ICD-10-CM | POA: Diagnosis not present

## 2021-05-13 DIAGNOSIS — L818 Other specified disorders of pigmentation: Secondary | ICD-10-CM | POA: Diagnosis not present

## 2021-05-13 DIAGNOSIS — D485 Neoplasm of uncertain behavior of skin: Secondary | ICD-10-CM | POA: Diagnosis not present

## 2021-05-20 DIAGNOSIS — H524 Presbyopia: Secondary | ICD-10-CM | POA: Diagnosis not present

## 2021-05-20 DIAGNOSIS — H2513 Age-related nuclear cataract, bilateral: Secondary | ICD-10-CM | POA: Diagnosis not present

## 2021-08-12 DIAGNOSIS — I10 Essential (primary) hypertension: Secondary | ICD-10-CM | POA: Diagnosis not present

## 2021-08-12 DIAGNOSIS — E539 Vitamin B deficiency, unspecified: Secondary | ICD-10-CM | POA: Diagnosis not present

## 2021-08-12 DIAGNOSIS — E559 Vitamin D deficiency, unspecified: Secondary | ICD-10-CM | POA: Diagnosis not present

## 2021-08-12 DIAGNOSIS — R7301 Impaired fasting glucose: Secondary | ICD-10-CM | POA: Diagnosis not present

## 2021-10-04 DIAGNOSIS — Z23 Encounter for immunization: Secondary | ICD-10-CM | POA: Diagnosis not present

## 2021-10-04 DIAGNOSIS — R9431 Abnormal electrocardiogram [ECG] [EKG]: Secondary | ICD-10-CM | POA: Diagnosis not present

## 2021-10-04 DIAGNOSIS — S01112A Laceration without foreign body of left eyelid and periocular area, initial encounter: Secondary | ICD-10-CM | POA: Diagnosis not present

## 2021-10-04 DIAGNOSIS — I10 Essential (primary) hypertension: Secondary | ICD-10-CM | POA: Diagnosis not present

## 2021-10-04 DIAGNOSIS — G8911 Acute pain due to trauma: Secondary | ICD-10-CM | POA: Diagnosis not present

## 2021-10-04 DIAGNOSIS — S199XXA Unspecified injury of neck, initial encounter: Secondary | ICD-10-CM | POA: Diagnosis not present

## 2021-10-04 DIAGNOSIS — R69 Illness, unspecified: Secondary | ICD-10-CM | POA: Diagnosis not present

## 2021-10-04 DIAGNOSIS — S0990XA Unspecified injury of head, initial encounter: Secondary | ICD-10-CM | POA: Diagnosis not present

## 2021-10-04 DIAGNOSIS — Z79899 Other long term (current) drug therapy: Secondary | ICD-10-CM | POA: Diagnosis not present

## 2021-10-04 DIAGNOSIS — W1812XA Fall from or off toilet with subsequent striking against object, initial encounter: Secondary | ICD-10-CM | POA: Diagnosis not present

## 2021-10-05 DIAGNOSIS — S0990XA Unspecified injury of head, initial encounter: Secondary | ICD-10-CM | POA: Diagnosis not present

## 2021-10-05 DIAGNOSIS — S199XXA Unspecified injury of neck, initial encounter: Secondary | ICD-10-CM | POA: Diagnosis not present

## 2021-10-10 DIAGNOSIS — Z5181 Encounter for therapeutic drug level monitoring: Secondary | ICD-10-CM | POA: Diagnosis not present

## 2022-06-03 IMAGING — CR DG CHEST 2V
2 series · 2 of 2 positions shown · non-contrast
Comparison: None.

CLINICAL DATA: Productive cough.

EXAM:
CHEST - 2 VIEW

[w chest pa]
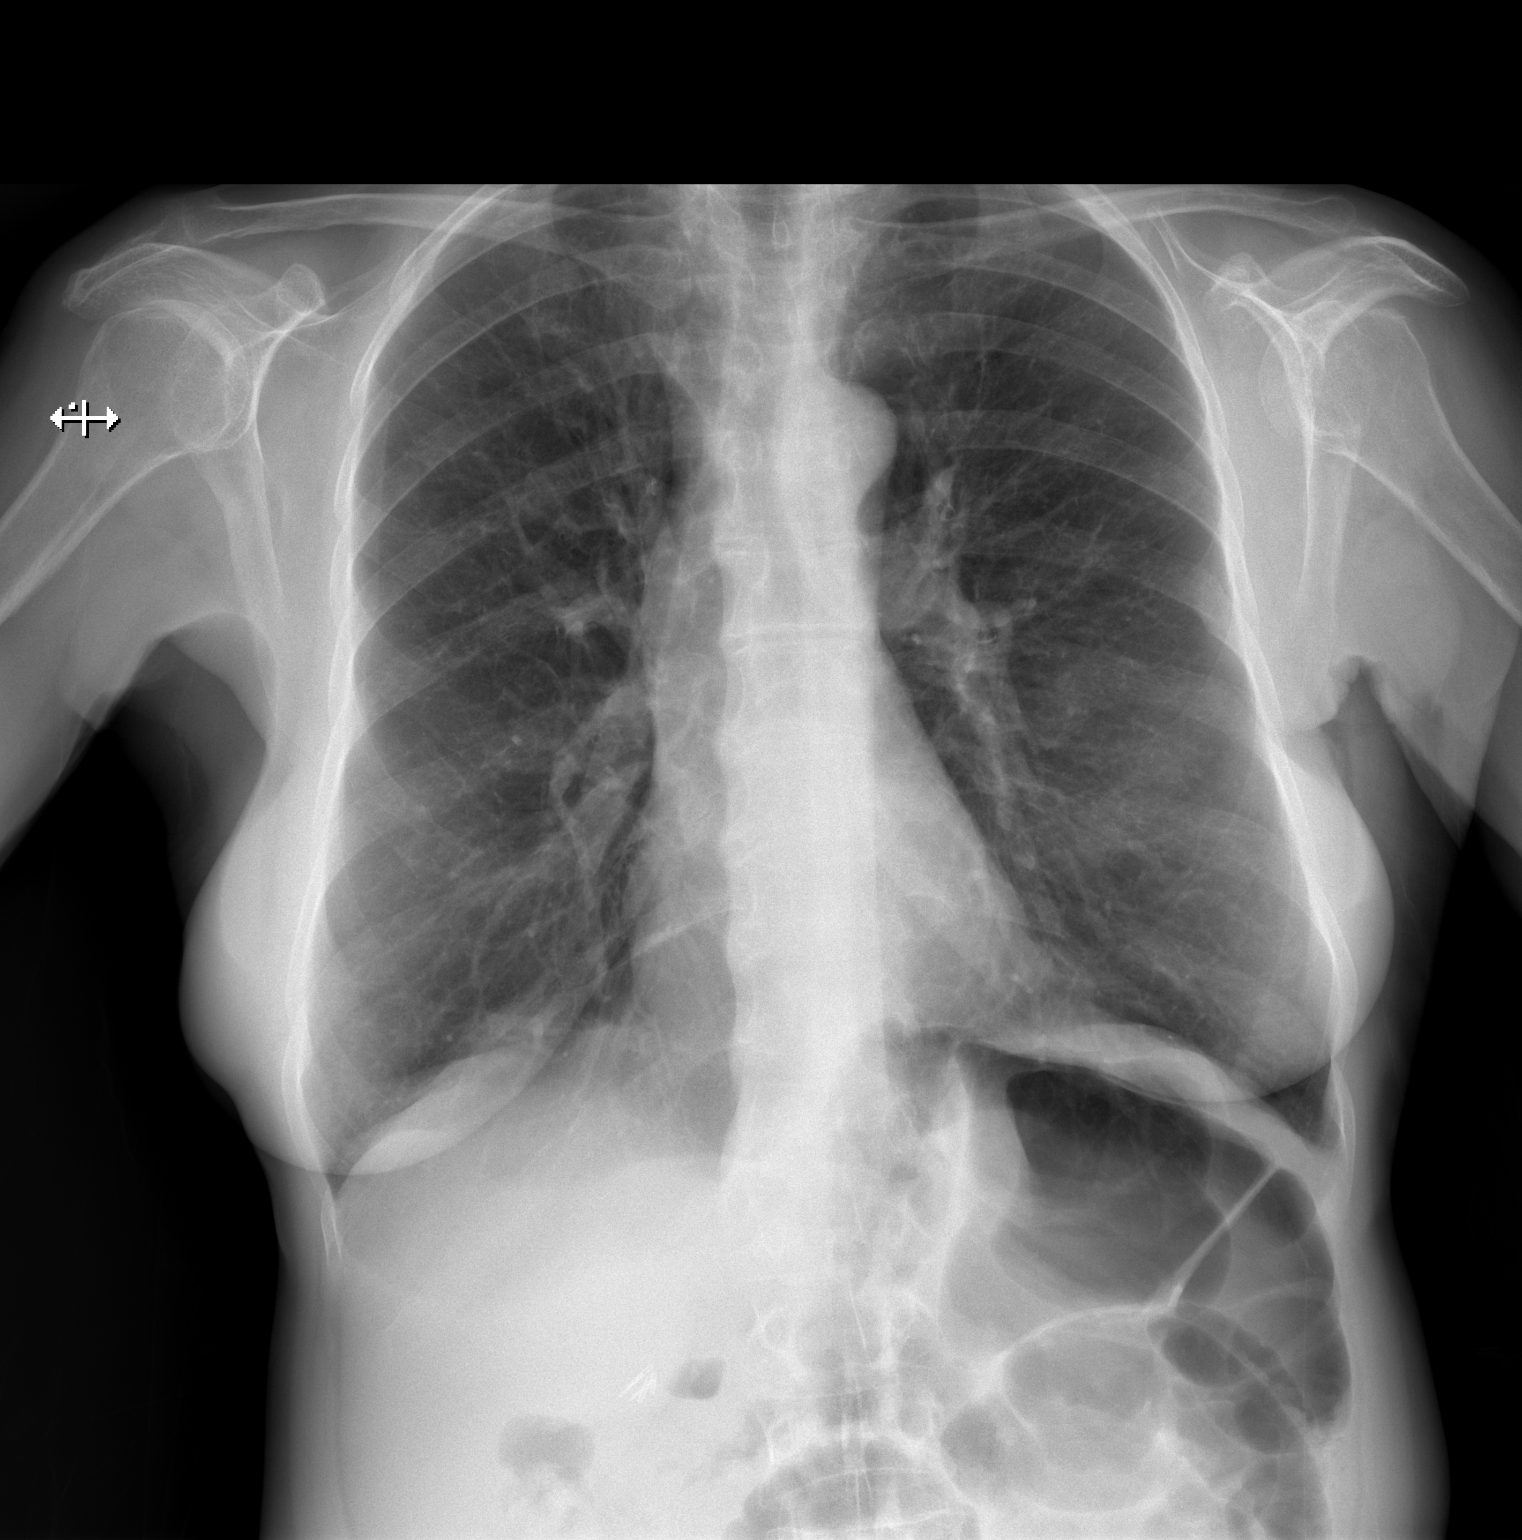

[w chest lat]
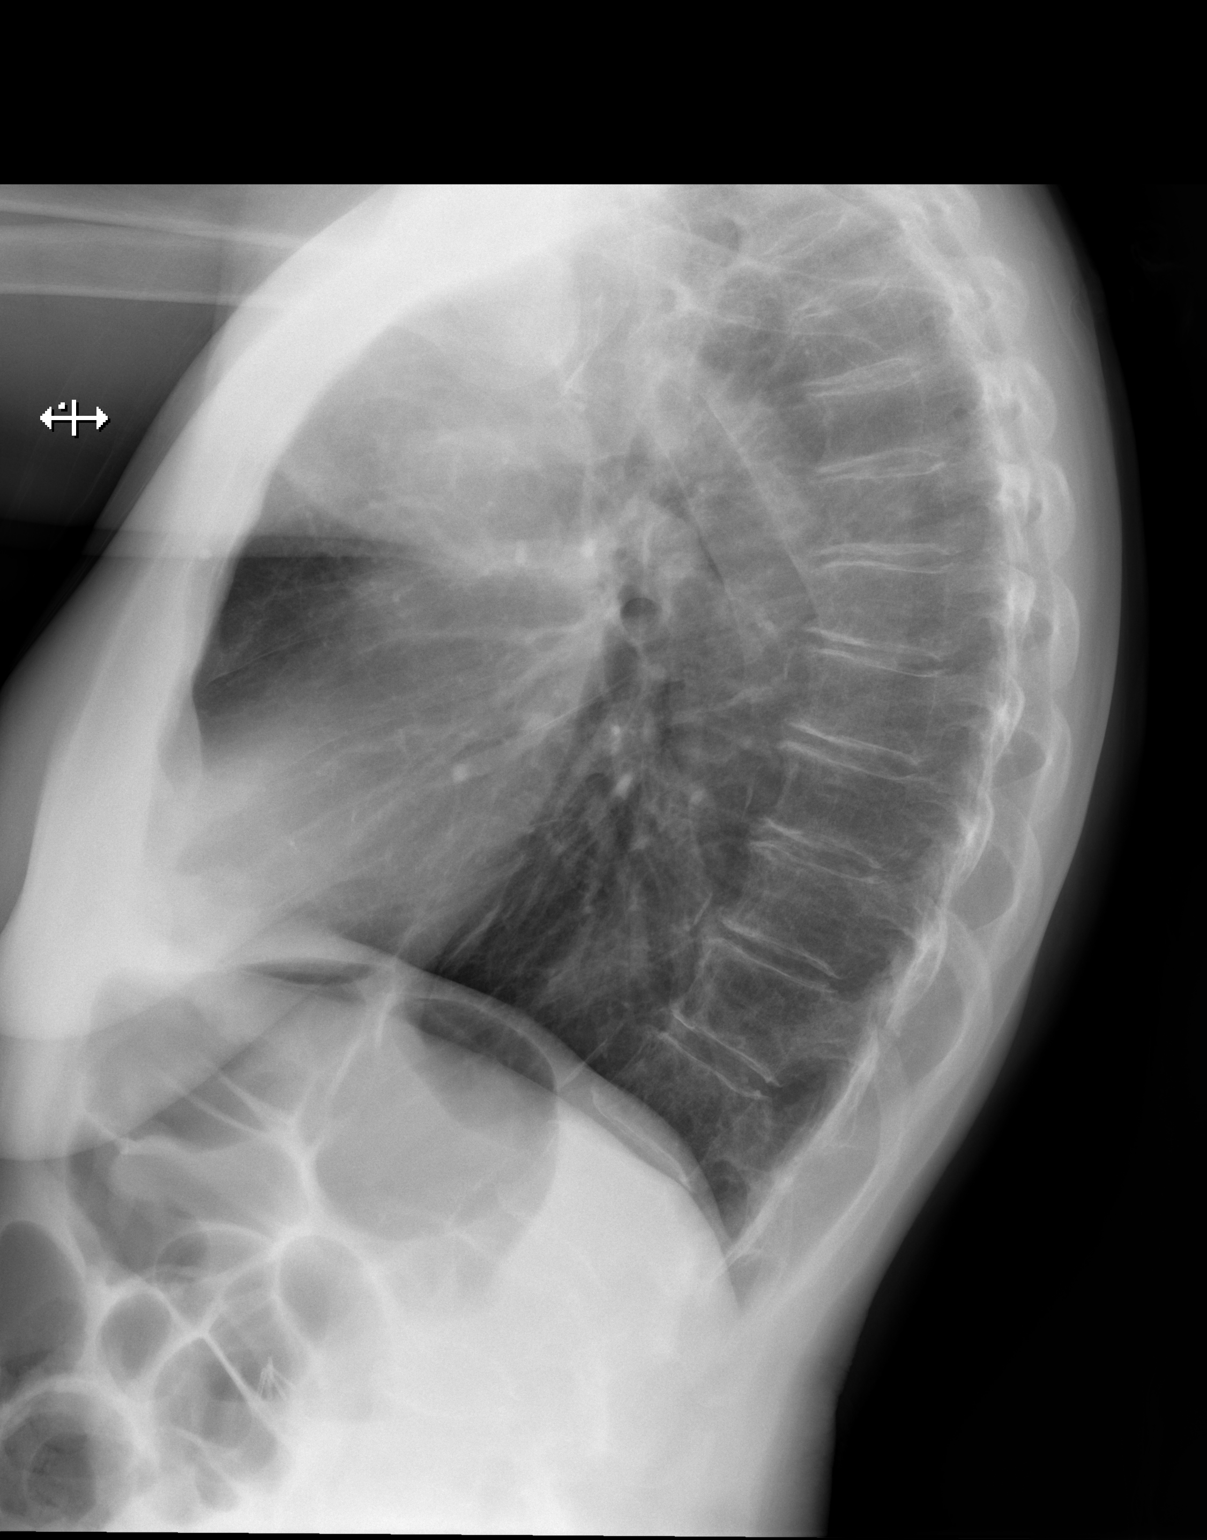

[2 of 2 positions shown; findings below may reference images not displayed]

FINDINGS: The heart size and mediastinal contours are within normal limits.
Both lungs are clear. The visualized skeletal structures are
unremarkable.
IMPRESSION: No active cardiopulmonary disease.
